# Patient Record
Sex: Female | Born: 1960 | Race: White | Hispanic: No | Marital: Married | Smoking: Smoker, current status unknown
Health system: Southern US, Community
[De-identification: ages and names within clinical notes are randomized; demographics above are authoritative.]

---

## 2012-05-22 DIAGNOSIS — F4322 Adjustment disorder with anxiety: Secondary | ICD-10-CM

## 2017-02-01 ENCOUNTER — Encounter (INDEPENDENT_AMBULATORY_CARE_PROVIDER_SITE_OTHER): Payer: Self-pay | Admitting: Orthopaedic Surgery

## 2017-02-01 ENCOUNTER — Ambulatory Visit (INDEPENDENT_AMBULATORY_CARE_PROVIDER_SITE_OTHER): Payer: 59

## 2017-02-01 ENCOUNTER — Ambulatory Visit (INDEPENDENT_AMBULATORY_CARE_PROVIDER_SITE_OTHER): Payer: 59 | Admitting: Orthopaedic Surgery

## 2017-02-01 DIAGNOSIS — M25551 Pain in right hip: Secondary | ICD-10-CM

## 2017-02-01 NOTE — Progress Notes (Signed)
Office Visit Note   Patient: Margaret MellowKerri Rollins           Date of Birth: [birth year]           MRN: [id number] Visit Date: 02/01/2017              Requested by: No referring provider defined for this encounter. PCP: Karle PlumberArvind, Moogali M, MD   Assessment & Plan: Visit Diagnoses:  1. Pain in right hip     Plan: Open MRI scan pelvis for persistent groin and pelvic pain. Continue with Advil and Tylenol and exercises. I think the problem is most likely soft tissue in nature. I don't see any bony abnormality. Hopefully MRI scan will be diagnostic  Follow-Up Instructions: Return after MRI pelvis.   Orders:  Orders Placed This Encounter  Procedures  . XR Pelvis 1-2 Views  . MR Pelvis w/o contrast   No orders of the defined types were placed in this encounter.     Procedures: No procedures performed   Clinical Data: No additional findings.   Subjective: Chief Complaint  Patient presents with  . Right Hip - Pain  . Hip Pain    Right groin pain with posterior radiating leg pain. Fall about 1 year ago, down steps. Has been moving heavy objects and driving lately. Pain increased September and October. Takes Advil.   Mrs Margaret Rollins is accompanied by her husband and here for evaluation of pain she is having in the area of her right thigh and groin. She initially had some discomfort after carrying and lifting and number of heavy objects [place] from the beach. [facility] and was told she might have a "bursitis". She was placed on a course of prednisone which made "a big difference". Thereafter she had another injury where she tried to grab her dog from running out of the house by gripping the dog between her legs. She felt a burning and sensation and pain along the inner aspect of her right thigh. No ecchymosis or erythema. She is really not had any numbness or tingling. Despite time exercises and over-the-counter medicines she continues to have some  discomfort which really bothers her. She's had trouble sleeping and even on occasion walking. She does not experience any back pain. Somewhat nondescript but later in the area of her initial tuberosity and the inner aspect of her thigh. No pain at the distal thigh and knee or even in her leg. No trouble with her left lower extremity.  HPI  Review of Systems   Objective: Vital Signs: There were no vitals taken for this visit.  Physical Exam  Ortho Exam awake alert and oriented 3. Comfortable sitting. Straight leg raise negative. No percussible tenderness lumbar spine. Pelvis level. No pain over the sacroiliac joints . Painless range of motion of her right hip with internal/external rotation. No specific pain over the ischial tuberosity of right buttock. No palpable medial right thigh pain or induration. No knee discomfort. Neurovascular exam intact.   Imaging: Xr Pelvis 1-2 Views  Result Date: 02/01/2017 AP the pelvis did not demonstrate any ectopic calcification. No evidence of fracture. No degenerative changes about either right or left hip. No irregularity around either greater trochanter. Some sclerosis about the sacroiliac joints which appear to be symmetrical. Abundant  bowel gas    PMFS History: There are no active problems to display for this patient.  History reviewed. No pertinent past medical history.  History reviewed. No  pertinent family history.  History reviewed. No pertinent surgical history. Social History   Occupational History  . Not on file  Tobacco Use  . Smoking status: Smoker, Current Status Unknown  . Smokeless tobacco: Never Used  Substance and Sexual Activity  . Alcohol use: Not on file  . Drug use: Not on file  . Sexual activity: Not on file     Valeria BatmanPeter W Whitfield, MD   Note - This record has been created using [employer] software.  Chart creation errors have been sought, but may not always  have been located. Such creation errors do not reflect  on  the standard of medical care.

## 2017-02-05 ENCOUNTER — Ambulatory Visit
Admission: RE | Admit: 2017-02-05 | Discharge: 2017-02-05 | Disposition: A | Discharge status: Home / Self Care | Payer: 59 | Source: Ambulatory Visit | Attending: Orthopaedic Surgery | Admitting: Orthopaedic Surgery

## 2017-02-05 DIAGNOSIS — M25551 Pain in right hip: Secondary | ICD-10-CM

## 2017-02-07 ENCOUNTER — Inpatient Hospital Stay: Admission: RE | Admit: 2017-02-07 | Payer: Self-pay | Source: Ambulatory Visit

## 2017-02-08 ENCOUNTER — Ambulatory Visit (INDEPENDENT_AMBULATORY_CARE_PROVIDER_SITE_OTHER): Payer: Self-pay | Admitting: Orthopaedic Surgery

## 2018-04-06 IMAGING — MR MR PELVIS W/O CM
4 of 5 series · 17 of 48 positions shown · non-contrast
Comparison: Pelvic radiograph dated 02/01/2017

ADDENDUM:
The original report was by Dr. M Iguel Elvira. The following addendum
is by Dr. Spikiri Bogatsu:

Along the left posterior side of the uterus there is a 5.5 by 4.1 by
3.5 cm structure with low speckled T2 signal and low T1 signal which
very much resembles surrounding rectosigmoid colon and associated
stool, but upon further review is thought to likely be a subserosal
fibroid extending from the lower uterine segment. If clinically
warranted this could be further worked up with pelvic sonography.
This is contralateral to the side of the patient's symptoms.
CLINICAL DATA: Right hip and buttock pain and numbness for 2
months. The pain radiates to the groin.
EXAM:
MRI PELVIS WITHOUT CONTRAST
TECHNIQUE: Multiplanar multisequence MR imaging of the pelvis was performed. No
intravenous contrast was administered.

[Series 2: STIR · coronal · 5.0mm · 0.70mm/px · 8 of 30 slices shown]
[im 1/30]
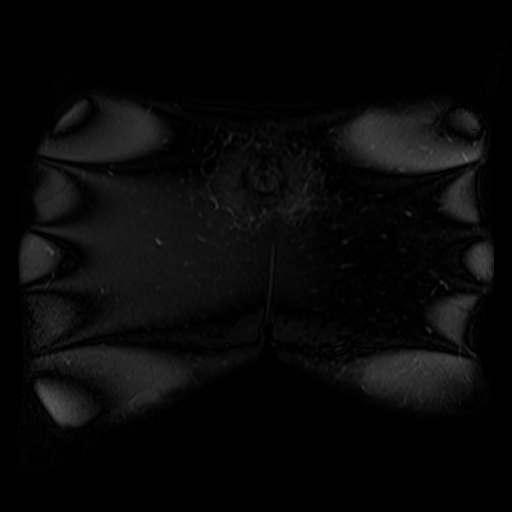
[im 5/30]
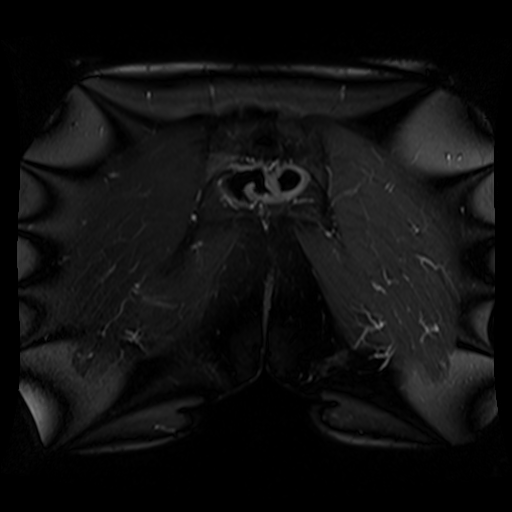
[im 9/30]
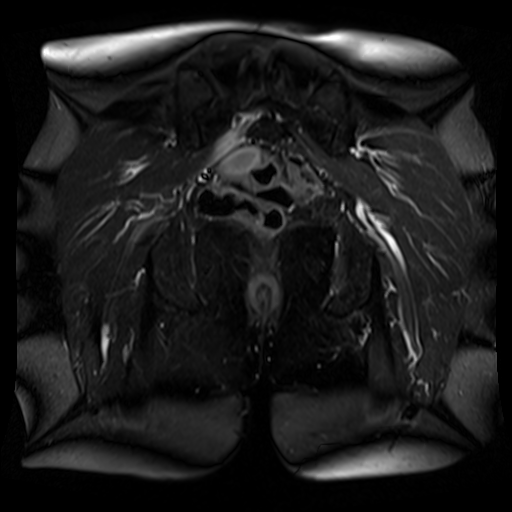
[im 13/30]
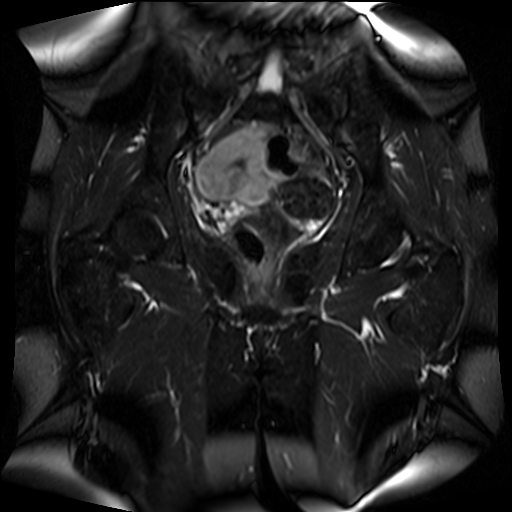
[im 17/30]
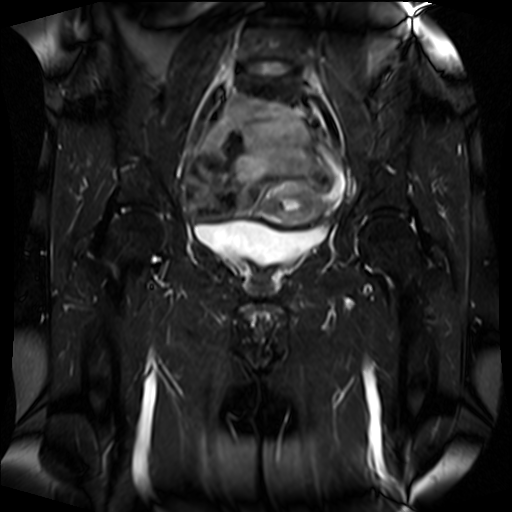
[im 21/30]
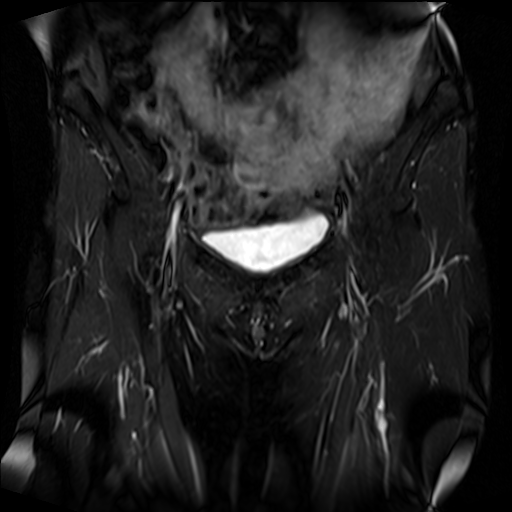
[im 25/30]
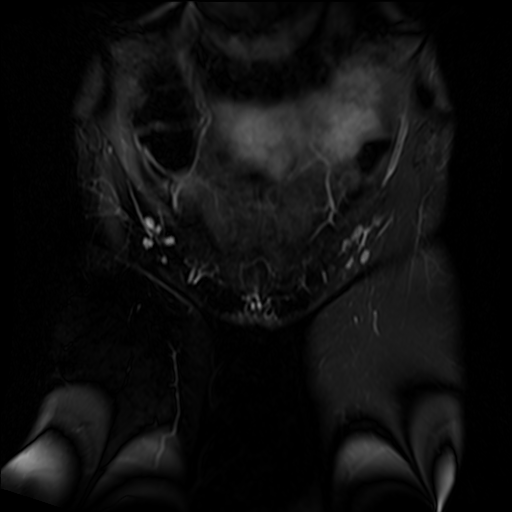
[im 30/30]
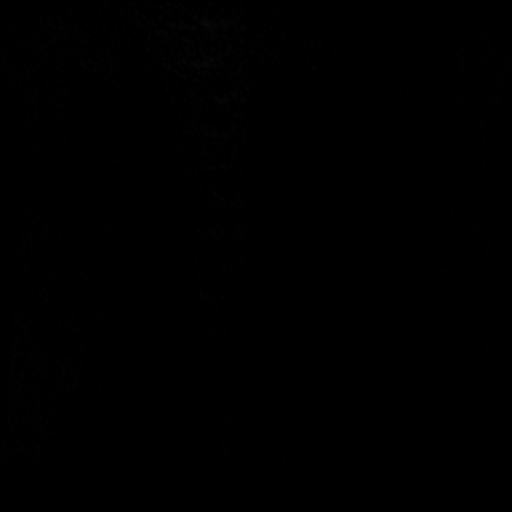

[Series 3: T1 · axial · 5.0mm · 0.56mm/px · z∈[-62,+113]mm · 3 of 38 slices shown (1 of 2)]
[im 5/38]
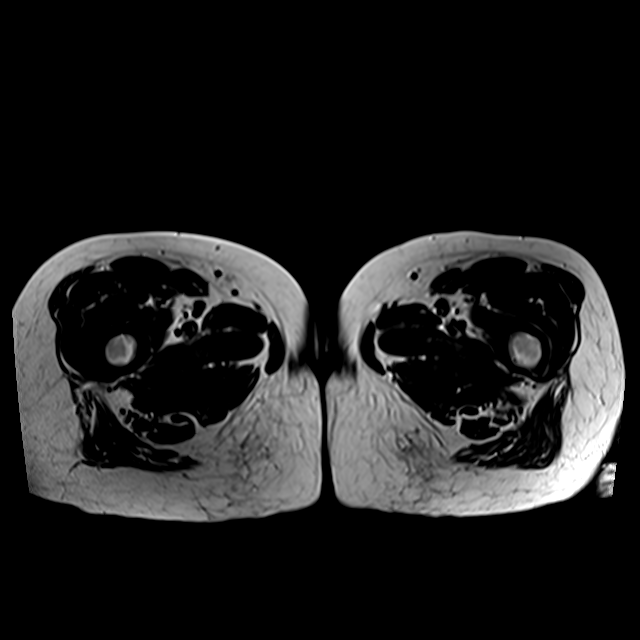
[im 21/38]
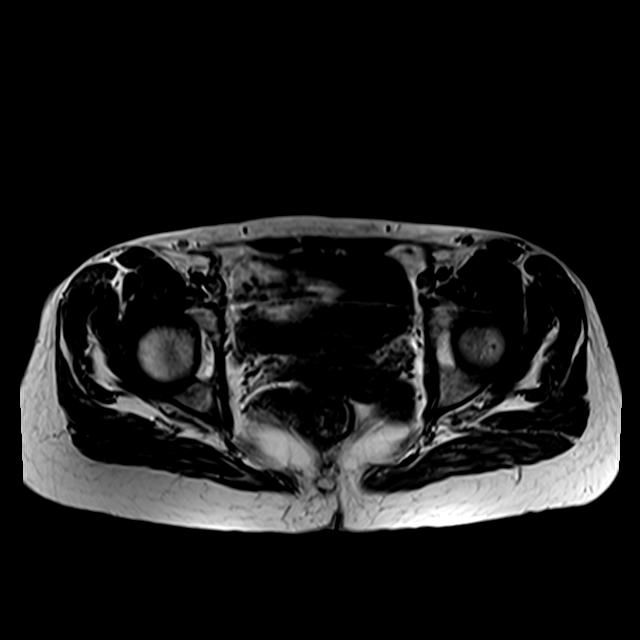
[im 33/38]
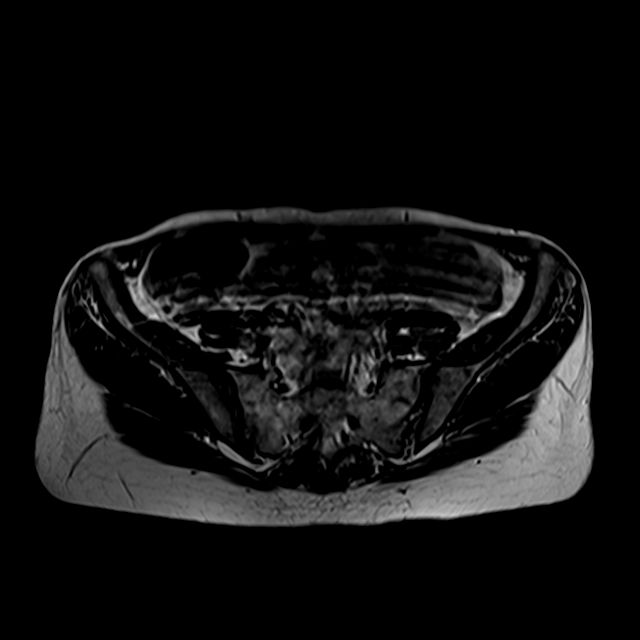

[Series 4: T2 fat-sat · axial · 5.0mm · 0.58mm/px · z∈[-62,+113]mm · 3 of 38 slices shown]
[im 5/38]
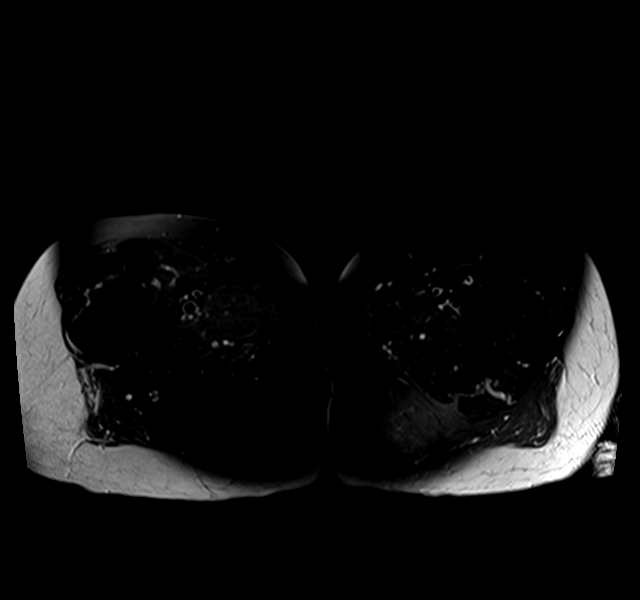
[im 21/38]
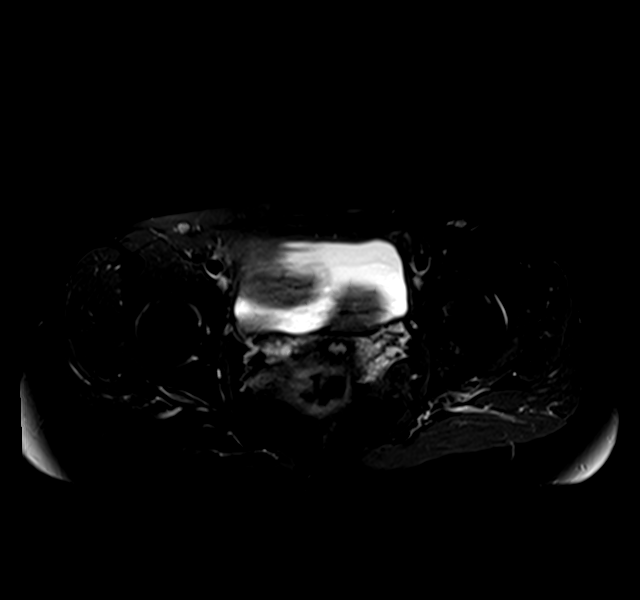
[im 33/38]
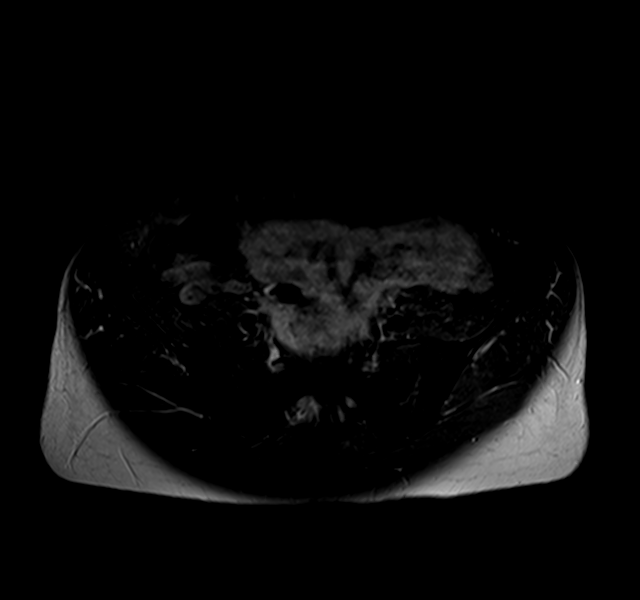

[Series 5: T1 · coronal · 5.0mm · 1.12mm/px · 3 of 30 slices shown (2 of 2)]
[im 5/30]
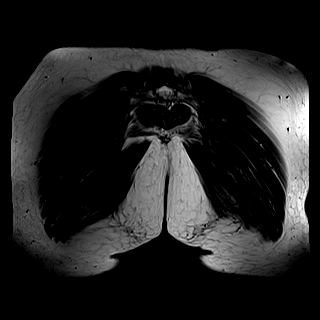
[im 17/30]
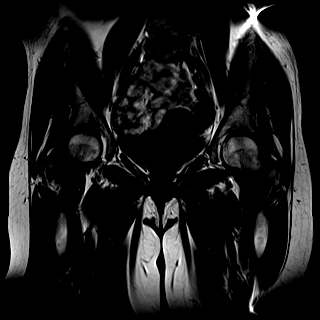
[im 25/30]
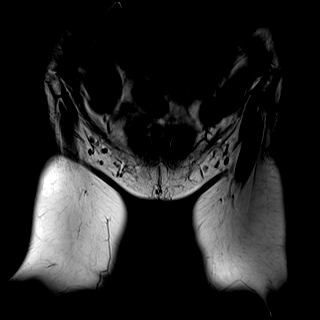

[17 of 48 positions shown; findings below may reference images not displayed]

FINDINGS: Urinary Tract:  No abnormality visualized.

Bowel:  Unremarkable visualized pelvic bowel loops.

Vascular/Lymphatic: No pathologically enlarged lymph nodes. No
significant vascular abnormality seen.

Reproductive:  No mass or other significant abnormality

Other:  No ascites.

Musculoskeletal: Slight arthritic changes at the anterior inferior
aspects of both sacroiliac joints. The bones of both hips appear
normal. Slight focal thinning of the articular cartilage of the
anterior superior aspect of the right hip seen on image 10 of series
6.

There are no hip effusions.  No bursitis.

The muscles around both hips and in the pelvis appear normal.
IMPRESSION: 1. Focal slight bilateral SI joint arthritis.
2. Tiny area of cartilage thinning in the right hip.
3. Otherwise, normal exam.
